# Patient Record
Sex: Female | Born: 1975 | Race: Black or African American | Hispanic: No | Marital: Single | Smoking: Never smoker
Health system: Southern US, Community
[De-identification: ages and names within clinical notes are randomized; demographics above are authoritative.]

## PROBLEM LIST (undated history)

## (undated) HISTORY — PX: TUBAL LIGATION: SHX77

---

## 2006-12-08 ENCOUNTER — Emergency Department (HOSPITAL_COMMUNITY): Admission: EM | Admit: 2006-12-08 | Discharge: 2006-12-08 | Payer: Self-pay | Admitting: Emergency Medicine

## 2009-06-06 ENCOUNTER — Emergency Department (HOSPITAL_COMMUNITY): Admission: EM | Admit: 2009-06-06 | Discharge: 2009-06-06 | Payer: Self-pay | Admitting: Emergency Medicine

## 2013-01-28 ENCOUNTER — Other Ambulatory Visit: Payer: Self-pay | Admitting: Obstetrics and Gynecology

## 2013-01-28 ENCOUNTER — Other Ambulatory Visit (HOSPITAL_COMMUNITY)
Admission: RE | Admit: 2013-01-28 | Discharge: 2013-01-28 | Disposition: A | Discharge status: Home / Self Care | Payer: Commercial Indemnity | Source: Ambulatory Visit | Attending: Obstetrics and Gynecology | Admitting: Obstetrics and Gynecology

## 2013-01-28 DIAGNOSIS — Z113 Encounter for screening for infections with a predominantly sexual mode of transmission: Secondary | ICD-10-CM | POA: Insufficient documentation

## 2013-01-28 DIAGNOSIS — Z1151 Encounter for screening for human papillomavirus (HPV): Secondary | ICD-10-CM | POA: Insufficient documentation

## 2013-01-28 DIAGNOSIS — Z01419 Encounter for gynecological examination (general) (routine) without abnormal findings: Secondary | ICD-10-CM | POA: Insufficient documentation

## 2013-08-16 ENCOUNTER — Encounter (HOSPITAL_BASED_OUTPATIENT_CLINIC_OR_DEPARTMENT_OTHER): Payer: Self-pay | Admitting: Emergency Medicine

## 2013-08-16 ENCOUNTER — Emergency Department (HOSPITAL_BASED_OUTPATIENT_CLINIC_OR_DEPARTMENT_OTHER)
Admission: EM | Admit: 2013-08-16 | Discharge: 2013-08-16 | Disposition: A | Discharge status: Home / Self Care | Payer: Commercial Indemnity | Attending: Emergency Medicine | Admitting: Emergency Medicine

## 2013-08-16 DIAGNOSIS — M549 Dorsalgia, unspecified: Secondary | ICD-10-CM

## 2013-08-16 DIAGNOSIS — M545 Low back pain, unspecified: Secondary | ICD-10-CM | POA: Insufficient documentation

## 2013-08-16 DIAGNOSIS — R209 Unspecified disturbances of skin sensation: Secondary | ICD-10-CM | POA: Insufficient documentation

## 2013-08-16 DIAGNOSIS — Z3202 Encounter for pregnancy test, result negative: Secondary | ICD-10-CM | POA: Insufficient documentation

## 2013-08-16 LAB — URINALYSIS, ROUTINE W REFLEX MICROSCOPIC
Bilirubin Urine: NEGATIVE
Ketones, ur: NEGATIVE mg/dL
Nitrite: NEGATIVE

## 2013-08-16 MED ORDER — HYDROCODONE-ACETAMINOPHEN 5-325 MG PO TABS: 2.0000 | ORAL_TABLET | ORAL | Status: DC | PRN

## 2013-08-16 MED ORDER — PREDNISONE 10 MG PO TABS: ORAL_TABLET | ORAL | Status: DC

## 2013-08-16 NOTE — ED Provider Notes (Signed)
Medical screening examination/treatment/procedure(s) were performed by non-physician practitioner and as supervising physician I was immediately available for consultation/collaboration.  EKG Interpretation   None         Megan E Docherty, MD 08/16/13 1557 

## 2013-08-16 NOTE — ED Notes (Signed)
Patient asked to change into gown. 

## 2013-08-16 NOTE — ED Notes (Signed)
Patient states she has a one week history of bilateral lower back pain with radiation into her buttocks and legs.  States her leg feel numb.  Denies injury, but does walk and lift heavy objects at work.

## 2013-08-16 NOTE — ED Provider Notes (Signed)
CSN: [id number]     Arrival date & time 08/16/13  1110 History   First MD Initiated Contact with Patient 08/16/13 1249     Chief Complaint  Patient presents with  . Back Pain   (Consider location/radiation/quality/duration/timing/severity/associated sxs/prior Treatment) Patient is a 37 y.o. female presenting with back pain. The history is provided by the patient. No language interpreter was used.  Back Pain Location:  Lumbar spine Quality:  Aching Radiates to:  Does not radiate Pain severity:  No pain Pain is:  Worse during the day Onset quality:  Gradual Timing:  Constant Progression:  Worsening Chronicity:  New Relieved by:  Nothing Worsened by:  Nothing tried Ineffective treatments:  None tried Associated symptoms: leg pain and numbness   Associated symptoms: no abdominal pain     History reviewed. No pertinent past medical history. Past Surgical History  Procedure Laterality Date  . Tubal ligation     No family history on file. History  Substance Use Topics  . Smoking status: Never Smoker   . Smokeless tobacco: Never Used  . Alcohol Use: Yes     Comment: occassionally   OB History   Grav Para Term Preterm Abortions TAB SAB Ect Mult Living                 Review of Systems  Gastrointestinal: Negative for abdominal pain.  Musculoskeletal: Positive for back pain.  Neurological: Positive for numbness.  All other systems reviewed and are negative.    Allergies  Review of patient's allergies indicates no known allergies.  Home Medications   Current Outpatient Rx  Name  Route  Sig  Dispense  Refill  . MULTIPLE VITAMIN PO   Oral   Take by mouth.          BP 121/85  Temp(Src) 98.6 F (37 C) (Oral)  Resp 20  Ht 5\' 6"  (1.676 m)  Wt 204 lb (92.534 kg)  BMI 32.94 kg/m2  SpO2 100%  LMP 08/13/2013 Physical Exam  Vitals reviewed. Constitutional: She is oriented to person, place, and time. She appears well-developed and well-nourished.  HENT:  Head:  Normocephalic.  Eyes: Pupils are equal, round, and reactive to light.  Neck: Normal range of motion.  Cardiovascular: Normal rate and normal heart sounds.   Pulmonary/Chest: Effort normal and breath sounds normal.  Abdominal: Soft.  Musculoskeletal: Normal range of motion.  Neurological: She is alert and oriented to person, place, and time. She has normal reflexes.  Skin: Skin is warm.  Psychiatric: She has a normal mood and affect.    ED Course  Procedures (including critical care time) Labs Review Labs Reviewed  URINALYSIS, ROUTINE W REFLEX MICROSCOPIC  PREGNANCY, URINE   Imaging Review No results found.  EKG Interpretation   None       MDM   1. Back pain        Elson Areas, [place] 08/16/13 1337

## 2014-05-03 ENCOUNTER — Other Ambulatory Visit: Payer: Self-pay | Admitting: Obstetrics and Gynecology

## 2016-07-13 ENCOUNTER — Encounter (HOSPITAL_BASED_OUTPATIENT_CLINIC_OR_DEPARTMENT_OTHER): Payer: Self-pay | Admitting: Emergency Medicine

## 2016-07-13 ENCOUNTER — Emergency Department (HOSPITAL_BASED_OUTPATIENT_CLINIC_OR_DEPARTMENT_OTHER): Payer: Managed Care, Other (non HMO)

## 2016-07-13 ENCOUNTER — Emergency Department (HOSPITAL_BASED_OUTPATIENT_CLINIC_OR_DEPARTMENT_OTHER)
Admission: EM | Admit: 2016-07-13 | Discharge: 2016-07-13 | Disposition: A | Discharge status: Home / Self Care | Payer: Managed Care, Other (non HMO) | Attending: Emergency Medicine | Admitting: Emergency Medicine

## 2016-07-13 DIAGNOSIS — W230XXA Caught, crushed, jammed, or pinched between moving objects, initial encounter: Secondary | ICD-10-CM | POA: Insufficient documentation

## 2016-07-13 DIAGNOSIS — Y929 Unspecified place or not applicable: Secondary | ICD-10-CM | POA: Insufficient documentation

## 2016-07-13 DIAGNOSIS — Y999 Unspecified external cause status: Secondary | ICD-10-CM | POA: Insufficient documentation

## 2016-07-13 DIAGNOSIS — S6991XA Unspecified injury of right wrist, hand and finger(s), initial encounter: Secondary | ICD-10-CM | POA: Insufficient documentation

## 2016-07-13 DIAGNOSIS — Y939 Activity, unspecified: Secondary | ICD-10-CM | POA: Insufficient documentation

## 2016-07-13 MED ORDER — ACETAMINOPHEN 500 MG PO TABS: 1000.0000 mg | ORAL_TABLET | Freq: Four times a day (QID) | ORAL | 0 refills | Status: AC | PRN

## 2016-07-13 MED ORDER — CEPHALEXIN 500 MG PO CAPS: 500.0000 mg | ORAL_CAPSULE | Freq: Four times a day (QID) | ORAL | 0 refills | Status: DC

## 2016-07-13 MED ORDER — TETANUS-DIPHTH-ACELL PERTUSSIS 5-2.5-18.5 LF-MCG/0.5 IM SUSP
0.5000 mL | Freq: Once | INTRAMUSCULAR | Status: AC
  Administered 2016-07-13: 0.5 mL via INTRAMUSCULAR
  Filled 2016-07-13: qty 0.5

## 2016-07-13 MED ORDER — IBUPROFEN 800 MG PO TABS: 800.0000 mg | ORAL_TABLET | Freq: Three times a day (TID) | ORAL | 0 refills | Status: DC

## 2016-07-13 MED ORDER — TETANUS-DIPHTH-ACELL PERTUSSIS 5-2.5-18.5 LF-MCG/0.5 IM SUSP
INTRAMUSCULAR | Status: AC
  Filled 2016-07-13: qty 0.5

## 2016-07-13 MED ORDER — HYDROCODONE-ACETAMINOPHEN 5-325 MG PO TABS
1.0000 | ORAL_TABLET | Freq: Once | ORAL | Status: AC
  Administered 2016-07-13: 1 via ORAL
  Filled 2016-07-13: qty 1

## 2016-07-13 NOTE — ED Provider Notes (Signed)
[facility]   CSN: [id number] Arrival date & time: 07/13/16  [id number]     History   Chief Complaint Chief Complaint  Patient presents with  . Finger Injury    HPI Kathy Adams is a 40 y.o. female.  HPI  Patient presents with traumatic, sudden onset, constant, moderate to severe right ring finger pain at the nail. Patient states last night around 11 PM she jammed her finger into a door resulting in complete removal of her acrylic and true nail. She attempted to wash it with cold water, but it was painful. She has taken ibuprofen with minimal relief. She is unsure of her last tetanus. Denies fever, numbness, weakness, or any other symptoms.   History reviewed. No pertinent past medical history.  There are no active problems to display for this patient.   Past Surgical History:  Procedure Laterality Date  . TUBAL LIGATION      OB History    No data available       Home Medications    Prior to Admission medications   Medication Sig Start Date End Date Taking? Authorizing Provider  MULTIPLE VITAMIN PO Take by mouth.   Yes Historical Provider, MD  acetaminophen (TYLENOL) 500 MG tablet Take 2 tablets (1,000 mg total) by mouth every 6 (six) hours as needed. 07/13/16   Cheri FowlerKayla Guilianna Mckoy, PA-C  cephALEXin (KEFLEX) 500 MG capsule Take 1 capsule (500 mg total) by mouth 4 (four) times daily. 07/13/16   Cheri FowlerKayla Demitri Kucinski, PA-C  HYDROcodone-acetaminophen (NORCO/VICODIN) 5-325 MG per tablet Take 2 tablets by mouth every 4 (four) hours as needed for pain. 08/16/13   Elson AreasLeslie K Sofia, PA-C  ibuprofen (ADVIL,MOTRIN) 800 MG tablet Take 1 tablet (800 mg total) by mouth 3 (three) times daily. 07/13/16   Cheri FowlerKayla Pahola Dimmitt, PA-C  predniSONE (DELTASONE) 10 MG tablet 6,5,4,3,2,1 taper 08/16/13   Elson AreasLeslie K Sofia, PA-C    Family History No family history on file.  Social History Social History  Substance Use Topics  . Smoking status: Never Smoker  . Smokeless tobacco: Never Used  .  Alcohol use Yes     Comment: occassionally     Allergies   Review of patient's allergies indicates no known allergies.   Review of Systems Review of Systems All other systems negative unless otherwise stated in HPI   Physical Exam Updated Vital Signs BP 115/80 (BP Location: Left Arm)   Pulse (!) 56   Temp 98.2 F (36.8 C) (Oral)   Resp 16   Ht 5\' 6"  (1.676 m)   Wt 95.5 kg   LMP 07/04/2016   SpO2 100%   BMI 34.00 kg/m   Physical Exam  Constitutional: She is oriented to person, place, and time. She appears well-developed and well-nourished.  HENT:  Head: Normocephalic and atraumatic.  Right Ear: External ear normal.  Left Ear: External ear normal.  Eyes: Conjunctivae are normal. No scleral icterus.  Neck: No tracheal deviation present.  Cardiovascular:  Pulses:      Radial pulses are 2+ on the right side, and 2+ on the left side.  Brisk capillary refill.   Pulmonary/Chest: Effort normal. No respiratory distress.  Abdominal: She exhibits no distension.  Musculoskeletal: Normal range of motion. She exhibits tenderness.  Neurological: She is alert and oriented to person, place, and time.  Strength and sensation intact.   Skin: Skin is warm and dry.  Right ring finger with complete removal of nail plate.  Nail bed and nail matrix intact. Eponychium  and proximal nail folds intact as well.   Psychiatric: She has a normal mood and affect. Her behavior is normal.     ED Treatments / Results  Labs (all labs ordered are listed, but only abnormal results are displayed) Labs Reviewed - No data to display  EKG  EKG Interpretation None       Radiology Dg Finger Ring Right  Result Date: 07/13/2016 CLINICAL DATA:  Trauma to 4th digit, nail bed avulsion EXAM: RIGHT RING FINGER 2+V COMPARISON:  None. FINDINGS: No fracture or dislocation is seen. The joint spaces are preserved. The visualized soft tissues are unremarkable. IMPRESSION: No fracture or dislocation is seen.  Electronically Signed   By: Charline Bills M.D.   On: 07/13/2016 10:04    Procedures Procedures (including critical care time)  Medications Ordered in ED Medications  Tdap (BOOSTRIX) injection 0.5 mL (0.5 mLs Intramuscular Given 07/13/16 1056)  HYDROcodone-acetaminophen (NORCO/VICODIN) 5-325 MG per tablet 1 tablet (1 tablet Oral Given 07/13/16 1029)     Initial Impression / Assessment and Plan / ED Course  I have reviewed the triage vital signs and the nursing notes.  Pertinent labs & imaging results that were available during my care of the patient were reviewed by me and considered in my medical decision making (see chart for details).  Clinical Course   Plain films negative.  Nail matrix and nail bed intact.  No indication for repair.  Wound was cleaned and dressed.  Tetanus up dated.  Instructed on proper care.  Follow up hand surgery.  Return precautions discussed.  Home with ppx keflex. Stable for discharge.   Final Clinical Impressions(s) / ED Diagnoses   Final diagnoses:  Fingernail injury, right, initial encounter    New Prescriptions Discharge Medication List as of 07/13/2016 11:15 AM    START taking these medications   Details  acetaminophen (TYLENOL) 500 MG tablet Take 2 tablets (1,000 mg total) by mouth every 6 (six) hours as needed., Starting Sun 07/13/2016, Print    cephALEXin (KEFLEX) 500 MG capsule Take 1 capsule (500 mg total) by mouth 4 (four) times daily., Starting Sun 07/13/2016, Print    ibuprofen (ADVIL,MOTRIN) 800 MG tablet Take 1 tablet (800 mg total) by mouth 3 (three) times daily., Starting Sun 07/13/2016, Print         Cheri Fowler, PA-C 07/13/16 2010    Nelva Nay, MD 07/14/16 [phone]

## 2016-07-13 NOTE — ED Triage Notes (Signed)
States hit her right ring finger against the door last night. Nail removed from nail bed, no bleeding at present..Marland Kitchen

## 2016-07-13 NOTE — Discharge Instructions (Signed)
Your xrays today are normal.  Keep your finger clean and dry.  Wash it with soap and water 2-3 times daily.  Apply bacitracin after cleaning and apply non-stick gauze.  It will take a couple of months for your fingernail to grow back.  Take keflex twice daily for 5 days.  You may take Ibuprofen three times daily and Tylenol every 6 hours for pain.  Follow up with hand surgery.  Return if you experience worsening pain, fever, increased swelling or redness, pus, or any new symptoms.

## 2019-08-20 ENCOUNTER — Emergency Department (HOSPITAL_BASED_OUTPATIENT_CLINIC_OR_DEPARTMENT_OTHER): Payer: PRIVATE HEALTH INSURANCE

## 2019-08-20 ENCOUNTER — Other Ambulatory Visit: Payer: Self-pay

## 2019-08-20 ENCOUNTER — Encounter (HOSPITAL_BASED_OUTPATIENT_CLINIC_OR_DEPARTMENT_OTHER): Payer: Self-pay

## 2019-08-20 ENCOUNTER — Emergency Department (HOSPITAL_BASED_OUTPATIENT_CLINIC_OR_DEPARTMENT_OTHER)
Admission: EM | Admit: 2019-08-20 | Discharge: 2019-08-20 | Disposition: A | Discharge status: Home / Self Care | Payer: PRIVATE HEALTH INSURANCE | Attending: Emergency Medicine | Admitting: Emergency Medicine

## 2019-08-20 DIAGNOSIS — Y939 Activity, unspecified: Secondary | ICD-10-CM | POA: Insufficient documentation

## 2019-08-20 DIAGNOSIS — Y999 Unspecified external cause status: Secondary | ICD-10-CM | POA: Insufficient documentation

## 2019-08-20 DIAGNOSIS — M545 Low back pain: Secondary | ICD-10-CM | POA: Insufficient documentation

## 2019-08-20 DIAGNOSIS — Y92410 Unspecified street and highway as the place of occurrence of the external cause: Secondary | ICD-10-CM | POA: Diagnosis not present

## 2019-08-20 DIAGNOSIS — M79601 Pain in right arm: Secondary | ICD-10-CM | POA: Diagnosis not present

## 2019-08-20 LAB — PREGNANCY, URINE: Preg Test, Ur: NEGATIVE

## 2019-08-20 MED ORDER — LIDOCAINE 5 % EX PTCH: 1.0000 | MEDICATED_PATCH | CUTANEOUS | 0 refills | Status: DC

## 2019-08-20 MED ORDER — NAPROXEN 375 MG PO TABS: 375.0000 mg | ORAL_TABLET | Freq: Two times a day (BID) | ORAL | 0 refills | Status: DC

## 2019-08-20 MED ORDER — ACETAMINOPHEN 500 MG PO TABS
1000.0000 mg | ORAL_TABLET | Freq: Once | ORAL | Status: AC
  Administered 2019-08-20: 06:00:00 1000 mg via ORAL
  Filled 2019-08-20: qty 2

## 2019-08-20 MED ORDER — NAPROXEN 250 MG PO TABS
500.0000 mg | ORAL_TABLET | Freq: Once | ORAL | Status: AC
  Administered 2019-08-20: 06:00:00 500 mg via ORAL
  Filled 2019-08-20: qty 2

## 2019-08-20 NOTE — ED Notes (Signed)
Pt transported to XR at this time.
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
## 2019-10-13 ENCOUNTER — Emergency Department (HOSPITAL_COMMUNITY)
Admission: EM | Admit: 2019-10-13 | Discharge: 2019-10-13 | Disposition: A | Discharge status: Home / Self Care | Payer: Commercial Indemnity | Attending: Emergency Medicine | Admitting: Emergency Medicine

## 2019-10-13 ENCOUNTER — Emergency Department (HOSPITAL_COMMUNITY): Payer: Commercial Indemnity

## 2019-10-13 ENCOUNTER — Other Ambulatory Visit: Payer: Self-pay

## 2019-10-13 ENCOUNTER — Encounter (HOSPITAL_COMMUNITY): Payer: Self-pay | Admitting: Emergency Medicine

## 2019-10-13 DIAGNOSIS — U071 COVID-19: Secondary | ICD-10-CM | POA: Insufficient documentation

## 2019-10-13 DIAGNOSIS — R531 Weakness: Secondary | ICD-10-CM | POA: Diagnosis present

## 2019-10-13 LAB — POC SARS CORONAVIRUS 2 AG -  ED: SARS Coronavirus 2 Ag: NEGATIVE

## 2019-10-13 LAB — URINALYSIS, ROUTINE W REFLEX MICROSCOPIC
Bilirubin Urine: NEGATIVE
Glucose, UA: NEGATIVE mg/dL
Hgb urine dipstick: NEGATIVE
Ketones, ur: NEGATIVE mg/dL
Leukocytes,Ua: NEGATIVE
Nitrite: NEGATIVE
Protein, ur: NEGATIVE mg/dL
Specific Gravity, Urine: 1.008 (ref 1.005–1.030)
pH: 8 (ref 5.0–8.0)

## 2019-10-13 LAB — RESPIRATORY PANEL BY RT PCR (FLU A&B, COVID)
Influenza A by PCR: NEGATIVE
Influenza B by PCR: NEGATIVE
SARS Coronavirus 2 by RT PCR: POSITIVE — AB

## 2019-10-13 LAB — I-STAT BETA HCG BLOOD, ED (MC, WL, AP ONLY): I-stat hCG, quantitative: <5 m[IU]/mL (ref <5)

## 2019-10-13 LAB — BASIC METABOLIC PANEL
Anion gap: 8 (ref 5–15)
BUN: 11 mg/dL (ref 6–20)
CO2: 24 mmol/L (ref 22–32)
Calcium: 8.6 mg/dL — ABNORMAL LOW (ref 8.9–10.3)
Chloride: 106 mmol/L (ref 98–111)
Creatinine, Ser: 1 mg/dL (ref 0.44–1.00)
GFR calc Af Amer: >60 mL/min (ref >60)
GFR calc non Af Amer: >60 mL/min (ref >60)
Glucose, Bld: 147 mg/dL — ABNORMAL HIGH (ref 70–99)
Potassium: 3.8 mmol/L (ref 3.5–5.1)
Sodium: 138 mmol/L (ref 135–145)

## 2019-10-13 LAB — CBC
HCT: 35.2 % — ABNORMAL LOW (ref 36.0–46.0)
Hemoglobin: 11.4 g/dL — ABNORMAL LOW (ref 12.0–15.0)
MCH: 27.6 pg (ref 26.0–34.0)
MCHC: 32.4 g/dL (ref 30.0–36.0)
MCV: 85.2 fL (ref 80.0–100.0)
Platelets: 254 10*3/uL (ref 150–400)
RBC: 4.13 MIL/uL (ref 3.87–5.11)
RDW: 13.5 % (ref 11.5–15.5)
WBC: 5.2 10*3/uL (ref 4.0–10.5)
nRBC: 0 % (ref 0.0–0.2)

## 2019-10-13 LAB — CBG MONITORING, ED: Glucose-Capillary: 116 mg/dL — ABNORMAL HIGH (ref 70–99)

## 2019-10-13 MED ORDER — PROMETHAZINE HCL 25 MG PO TABS
12.5000 mg | ORAL_TABLET | Freq: Once | ORAL | Status: AC
  Administered 2019-10-13: 12.5 mg via ORAL
  Filled 2019-10-13: qty 1

## 2019-10-13 MED ORDER — SODIUM CHLORIDE 0.9% FLUSH: 3.0000 mL | Freq: Once | INTRAVENOUS | Status: DC

## 2019-10-13 MED ORDER — SODIUM CHLORIDE 0.9 % IV BOLUS: 1000.0000 mL | Freq: Once | INTRAVENOUS | Status: DC

## 2019-10-13 MED ORDER — PROMETHAZINE HCL 25 MG/ML IJ SOLN
12.5000 mg | Freq: Once | INTRAMUSCULAR | Status: DC
  Filled 2019-10-13: qty 1

## 2019-10-13 NOTE — Discharge Instructions (Signed)
Thank you for allowing us to care for you today.   Please return to the emergency department if you have any new or worsening symptoms.  You tested positive for covid-19 today.   Medications- You can take medications to help treat your symptoms: -Tylenol for fever and body aches. Please take as prescribed on the bottle. -Over the coutner cough medicine such as mucinex, robitussin, or other brands. -Flonase or saline nasal spray for nasal congestion -Vitamins as recommended by CDC  Treatment- This is a virus and unfortunately there are no antibitotics approved to treat this virus at this time. It is important to monitor your symptoms closely: -You should have a theremometer at home to check your temperature when feeling feverish. -Use a pulse ox meter to measure your oxygen when feeling short of breath.  -If your fever is over 100.4 despite taking tylenol or if your oxygen level drops below 94% these are reasons to rturn to the emergency department for further evaluation. Please call the emergency department before you come to make us aware.    We recommend you self-isolate for 10 days and to inform your work/family/friends that you has the virus.  They will need to self-quarantine for 14 days to monitor for symptoms.    Again: symptoms of shortness of breath, chest pain, difficulty breathing, new onset of confusion, any symptoms that are concerning. If any of these symptoms you should come to emergency department for evaluation.   I hope you feel better soon  

## 2019-10-13 NOTE — ED Triage Notes (Signed)
20g in RT> ac Given Zofran 4mg  IV 821 200cc of NS at 822
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
## 2019-10-13 NOTE — ED Notes (Signed)
Pts IV is hurting so RN dc it.
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
## 2023-03-06 ENCOUNTER — Ambulatory Visit
Admission: EM | Admit: 2023-03-06 | Discharge: 2023-03-06 | Disposition: A | Discharge status: Home / Self Care | Payer: Managed Care, Other (non HMO) | Attending: Family Medicine | Admitting: Family Medicine

## 2023-03-06 ENCOUNTER — Ambulatory Visit: Payer: Managed Care, Other (non HMO)

## 2023-03-06 ENCOUNTER — Encounter: Payer: Self-pay | Admitting: Emergency Medicine

## 2023-03-06 DIAGNOSIS — M4316 Spondylolisthesis, lumbar region: Secondary | ICD-10-CM

## 2023-03-06 MED ORDER — PREDNISONE 10 MG PO TABS: ORAL_TABLET | ORAL | 0 refills | Status: DC

## 2023-03-06 MED ORDER — CYCLOBENZAPRINE HCL 10 MG PO TABS: 10.0000 mg | ORAL_TABLET | Freq: Two times a day (BID) | ORAL | 0 refills | Status: AC | PRN

## 2023-03-06 MED ORDER — NAPROXEN 375 MG PO TABS: 375.0000 mg | ORAL_TABLET | Freq: Two times a day (BID) | ORAL | 0 refills | Status: AC

## 2023-03-06 MED ORDER — KETOROLAC TROMETHAMINE 60 MG/2ML IM SOLN
30.0000 mg | Freq: Once | INTRAMUSCULAR | Status: AC
  Administered 2023-03-06: 30 mg via INTRAMUSCULAR

## 2023-03-06 MED ORDER — LIDOCAINE 5 % EX PTCH: 1.0000 | MEDICATED_PATCH | CUTANEOUS | 0 refills | Status: AC

## 2023-03-06 MED ORDER — PREDNISONE 10 MG PO TABS: ORAL_TABLET | ORAL | 0 refills | Status: AC

## 2023-03-06 NOTE — Discharge Instructions (Addendum)
Your neck and back xray did not show any broken bones. You do have mild slipping of your lower back (Grade 1 anterolisthesis).  See handout for more information
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
